# Patient Record
Sex: Female | Born: 1935 | Race: White | Hispanic: No | Smoking: Current every day smoker
Health system: Southern US, Community
[De-identification: ages and names within clinical notes are randomized; demographics above are authoritative.]

## PROBLEM LIST (undated history)

## (undated) DIAGNOSIS — M349 Systemic sclerosis, unspecified: Secondary | ICD-10-CM

## (undated) DIAGNOSIS — IMO0002 Reserved for concepts with insufficient information to code with codable children: Secondary | ICD-10-CM

## (undated) DIAGNOSIS — M329 Systemic lupus erythematosus, unspecified: Secondary | ICD-10-CM

## (undated) DIAGNOSIS — R000 Tachycardia, unspecified: Secondary | ICD-10-CM

## (undated) DIAGNOSIS — M069 Rheumatoid arthritis, unspecified: Secondary | ICD-10-CM

## (undated) HISTORY — PX: BACK SURGERY: SHX140

## (undated) HISTORY — PX: ABDOMINAL HYSTERECTOMY: SHX81

## (undated) HISTORY — PX: LEG SURGERY: SHX1003

---

## 2014-12-13 ENCOUNTER — Emergency Department (HOSPITAL_BASED_OUTPATIENT_CLINIC_OR_DEPARTMENT_OTHER)
Admission: EM | Admit: 2014-12-13 | Discharge: 2014-12-13 | Disposition: A | Discharge status: Home / Self Care | Payer: Medicare Other | Attending: Emergency Medicine | Admitting: Emergency Medicine

## 2014-12-13 ENCOUNTER — Emergency Department (HOSPITAL_BASED_OUTPATIENT_CLINIC_OR_DEPARTMENT_OTHER): Payer: Medicare Other

## 2014-12-13 ENCOUNTER — Encounter (HOSPITAL_BASED_OUTPATIENT_CLINIC_OR_DEPARTMENT_OTHER): Payer: Self-pay

## 2014-12-13 DIAGNOSIS — S8992XA Unspecified injury of left lower leg, initial encounter: Secondary | ICD-10-CM | POA: Insufficient documentation

## 2014-12-13 DIAGNOSIS — W1839XA Other fall on same level, initial encounter: Secondary | ICD-10-CM | POA: Insufficient documentation

## 2014-12-13 DIAGNOSIS — Z720 Tobacco use: Secondary | ICD-10-CM | POA: Insufficient documentation

## 2014-12-13 DIAGNOSIS — S2232XA Fracture of one rib, left side, initial encounter for closed fracture: Secondary | ICD-10-CM

## 2014-12-13 DIAGNOSIS — Z880 Allergy status to penicillin: Secondary | ICD-10-CM | POA: Insufficient documentation

## 2014-12-13 DIAGNOSIS — Y9289 Other specified places as the place of occurrence of the external cause: Secondary | ICD-10-CM | POA: Diagnosis not present

## 2014-12-13 DIAGNOSIS — S2242XA Multiple fractures of ribs, left side, initial encounter for closed fracture: Secondary | ICD-10-CM | POA: Diagnosis not present

## 2014-12-13 DIAGNOSIS — Z8739 Personal history of other diseases of the musculoskeletal system and connective tissue: Secondary | ICD-10-CM | POA: Insufficient documentation

## 2014-12-13 DIAGNOSIS — Y999 Unspecified external cause status: Secondary | ICD-10-CM | POA: Diagnosis not present

## 2014-12-13 DIAGNOSIS — S29001A Unspecified injury of muscle and tendon of front wall of thorax, initial encounter: Secondary | ICD-10-CM | POA: Diagnosis present

## 2014-12-13 DIAGNOSIS — Y939 Activity, unspecified: Secondary | ICD-10-CM | POA: Diagnosis not present

## 2014-12-13 HISTORY — DX: Reserved for concepts with insufficient information to code with codable children: IMO0002

## 2014-12-13 HISTORY — DX: Rheumatoid arthritis, unspecified: M06.9

## 2014-12-13 HISTORY — DX: Systemic sclerosis, unspecified: M34.9

## 2014-12-13 HISTORY — DX: Systemic lupus erythematosus, unspecified: M32.9

## 2014-12-13 HISTORY — DX: Tachycardia, unspecified: R00.0

## 2014-12-13 MED ORDER — TRAMADOL HCL 50 MG PO TABS
50.0000 mg | ORAL_TABLET | Freq: Once | ORAL | Status: AC
  Administered 2014-12-13: 50 mg via ORAL
  Filled 2014-12-13: qty 1

## 2014-12-13 NOTE — ED Provider Notes (Signed)
CSN: [id number]     Arrival date & time 12/13/14  1402 History   First MD Initiated Contact with Patient 12/13/14 1459     Chief Complaint  Patient presents with  . Fall     (Consider location/radiation/quality/duration/timing/severity/associated sxs/prior Treatment) The history is provided by the patient.  Ariel Washington is a 81 y.o. female hx of lupus, rheumatoid arthritis, scleroderma here presenting with left-sided rib pain, left knee pain. Patient had a mechanical fall on July 2. Since then she has been having intermittent left-sided breast pain and rib pain. Minimal shortness of breath but no chest pain. Also has some left knee pain as well. Denies any loss of consciousness or head injury during that time. Sent here by her doctor for x-rays.   Past Medical History  Diagnosis Date  . Lupus   . RA (rheumatoid arthritis)   . Scleredema   . Tachycardia    Past Surgical History  Procedure Laterality Date  . Abdominal hysterectomy    . Leg surgery    . Back surgery     No family history on file. History  Substance Use Topics  . Smoking status: Current Every Day Smoker  . Smokeless tobacco: Not on file  . Alcohol Use: No   OB History    No data available     Review of Systems  Musculoskeletal:       L knee pain, L rib pain   All other systems reviewed and are negative.     Allergies  Penicillins and Sulfa antibiotics  Home Medications   Prior to Admission medications   Medication Sig Start Date End Date Taking? Authorizing Provider  Acetaminophen (TYLENOL EXTRA STRENGTH PO) Take by mouth.   Yes Historical Provider, MD  DiltiaZEM HCl (CARDIZEM PO) Take by mouth.   Yes Historical Provider, MD  Omeprazole (PRILOSEC PO) Take by mouth.   Yes Historical Provider, MD  TRAMADOL HCL PO Take by mouth.   Yes Historical Provider, MD  UNKNOWN TO PATIENT HTN med   Yes Historical Provider, MD   BP 146/70 mmHg  Pulse 91  Temp(Src) 98.2 F (36.8 C) (Oral)  Resp 16  Ht 5'  3" (1.6 m)  Wt 165 lb (74.844 kg)  BMI 29.24 kg/m2  SpO2 99% Physical Exam  Constitutional: She is oriented to person, place, and time. She appears well-developed.  HENT:  Head: Normocephalic and atraumatic.  Mouth/Throat: Oropharynx is clear and moist.  Eyes: Conjunctivae are normal. Pupils are equal, round, and reactive to light.  Neck: Normal range of motion. Neck supple.  Cardiovascular: Normal rate, regular rhythm and normal heart sounds.   Pulmonary/Chest: Effort normal and breath sounds normal.  Reproducible L rib tenderness, no obvious ecchymosis or deformity   Abdominal: Soft. Bowel sounds are normal. She exhibits no distension. There is no tenderness.  Musculoskeletal:  L knee with small effusion, nl ROM, no obvious deformity. 2+ pulses   Neurological: She is alert and oriented to person, place, and time.  Skin: Skin is warm and dry.  Psychiatric: She has a normal mood and affect. Her behavior is normal. Judgment and thought content normal.  Nursing note and vitals reviewed.   ED Course  Procedures (including critical care time) Labs Review Labs Reviewed - No data to display  Imaging Review Dg Ribs Unilateral W/chest Left  12/13/2014   CLINICAL DATA:  Larey Seat with left anterior rib pain. History of lupus and rheumatoid arthritis.  EXAM: LEFT RIBS AND CHEST - 3+ VIEW  COMPARISON:  09/06/2014  FINDINGS: Subtle parenchymal lung densities are unchanged. No focal airspace disease. Heart and mediastinum are within normal limits. Atherosclerotic calcifications at the aortic arch. Stable joint space narrowing at the glenohumeral joints bilaterally. Question minimally displaced fractures involving the anterior left fifth and sixth ribs. Negative for pneumothorax.  IMPRESSION: Questionable fractures involving the anterior left fifth and sixth ribs.  Chronic lung changes without acute findings.  Chronic joint space narrowing at the glenohumeral joints.   Electronically Signed   By: Richarda OverlieAdam   Henn M.D.   On: 12/13/2014 15:47   Dg Knee Complete 4 Views Left  12/13/2014   CLINICAL DATA:  Fall onto the LEFT knee. Bruising and swelling in the anterior aspect of the knee. Unable the straighten knee. Initial encounter.  EXAM: LEFT KNEE - COMPLETE 4+ VIEW  COMPARISON:  None.  FINDINGS: Varus deformity of the knee is present. The knee is flexed at the time of frontal imaging. There is severe medial compartment osteoarthritis with complete loss of the joint space and chronic osteoarthritic remodeling. Calcified extruded meniscus is present. Large marginal osteophytes.  The lateral joint space is widened associated with the varus deformity. Chondrocalcinosis of the lateral meniscus. Severe patellofemoral osteoarthritis. Small knee effusion. Probable small calcified loose body in the lateral suprapatellar pouch. No fracture or acute osseous injury identified.  IMPRESSION: Severe medial and patellofemoral compartment osteoarthritis without an acute osseous abnormality. Varus deformity associated with collapse of the medial joint space.   Electronically Signed   By: Andreas NewportGeoffrey  Lamke M.D.   On: 12/13/2014 15:55     EKG Interpretation None      MDM   Final diagnoses:  None    Ariel Washington is a 81 y.o. female here with fall 10 days ago. Likely contusion but will get xrays. I doubt ACS.   4:45 PM Xray showed L 5th, 6th rib fractures. Vitals stable. Never hypoxic. Her tramadol was decreased by PMD, will increase back to usual dose of 1-2 Q6hrs prn.     Richardean Canalavid H Yao, MD 12/13/14 [phone]

## 2014-12-13 NOTE — ED Notes (Signed)
Pt fell onto walker 7/2-pain to both knee and under left breat area-states pain to left breast is worse with deep breaths

## 2014-12-13 NOTE — Discharge Instructions (Signed)
Increase tramadol to 1-2 tabs every 6 hrs for pain since you have rib fracture. Please have your doctor give you another prescription.   See your doctor.   Return to ER if you have worse pain, trouble breathing, chest pain.

## 2016-09-10 IMAGING — CR DG RIBS W/ CHEST 3+V*L*
3 series · 3 of 3 positions shown · non-contrast
Comparison: 09/06/2014

CLINICAL DATA: Fell with left anterior rib pain. History of lupus
and rheumatoid arthritis.

EXAM:
LEFT RIBS AND CHEST - 3+ VIEW

[w chest pa]
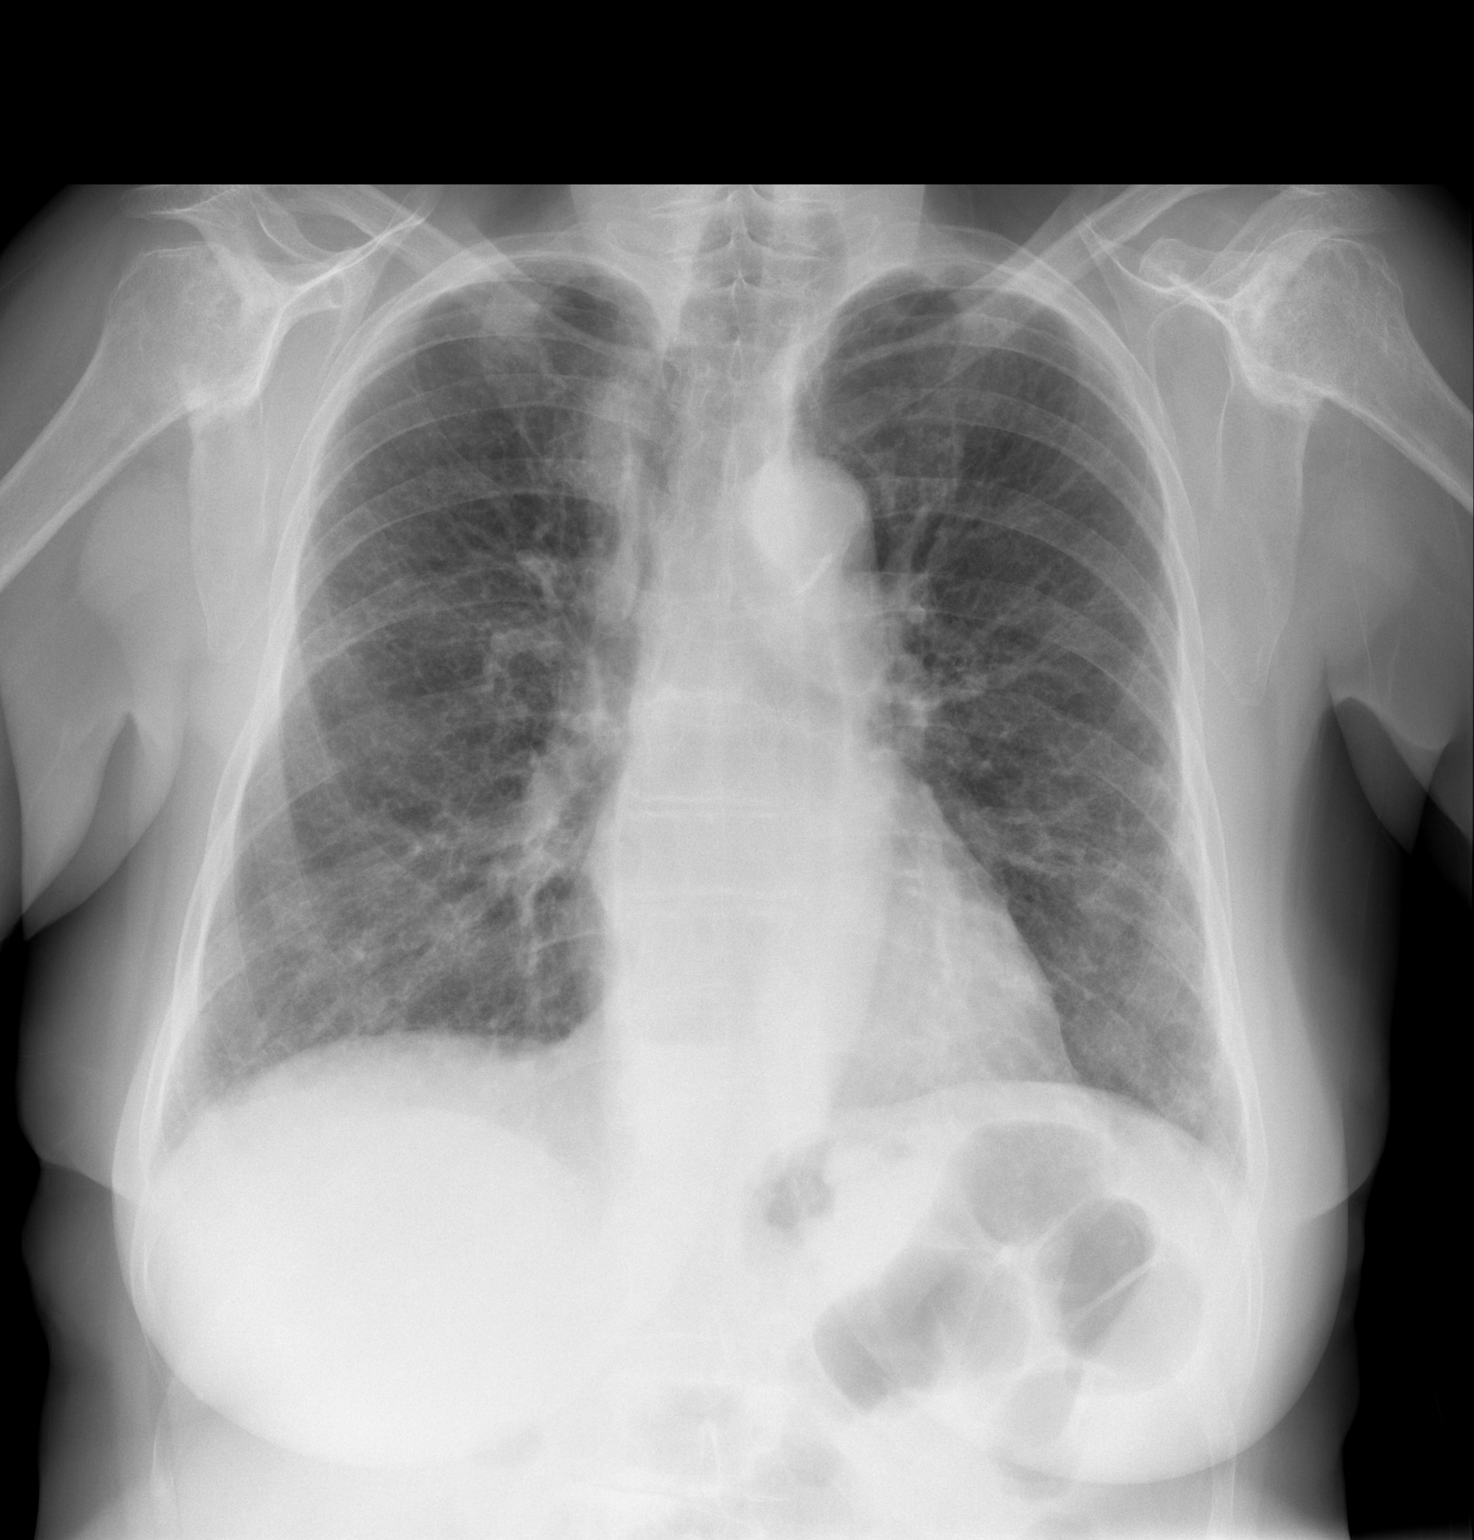

[w ribs ap/pa upper left]
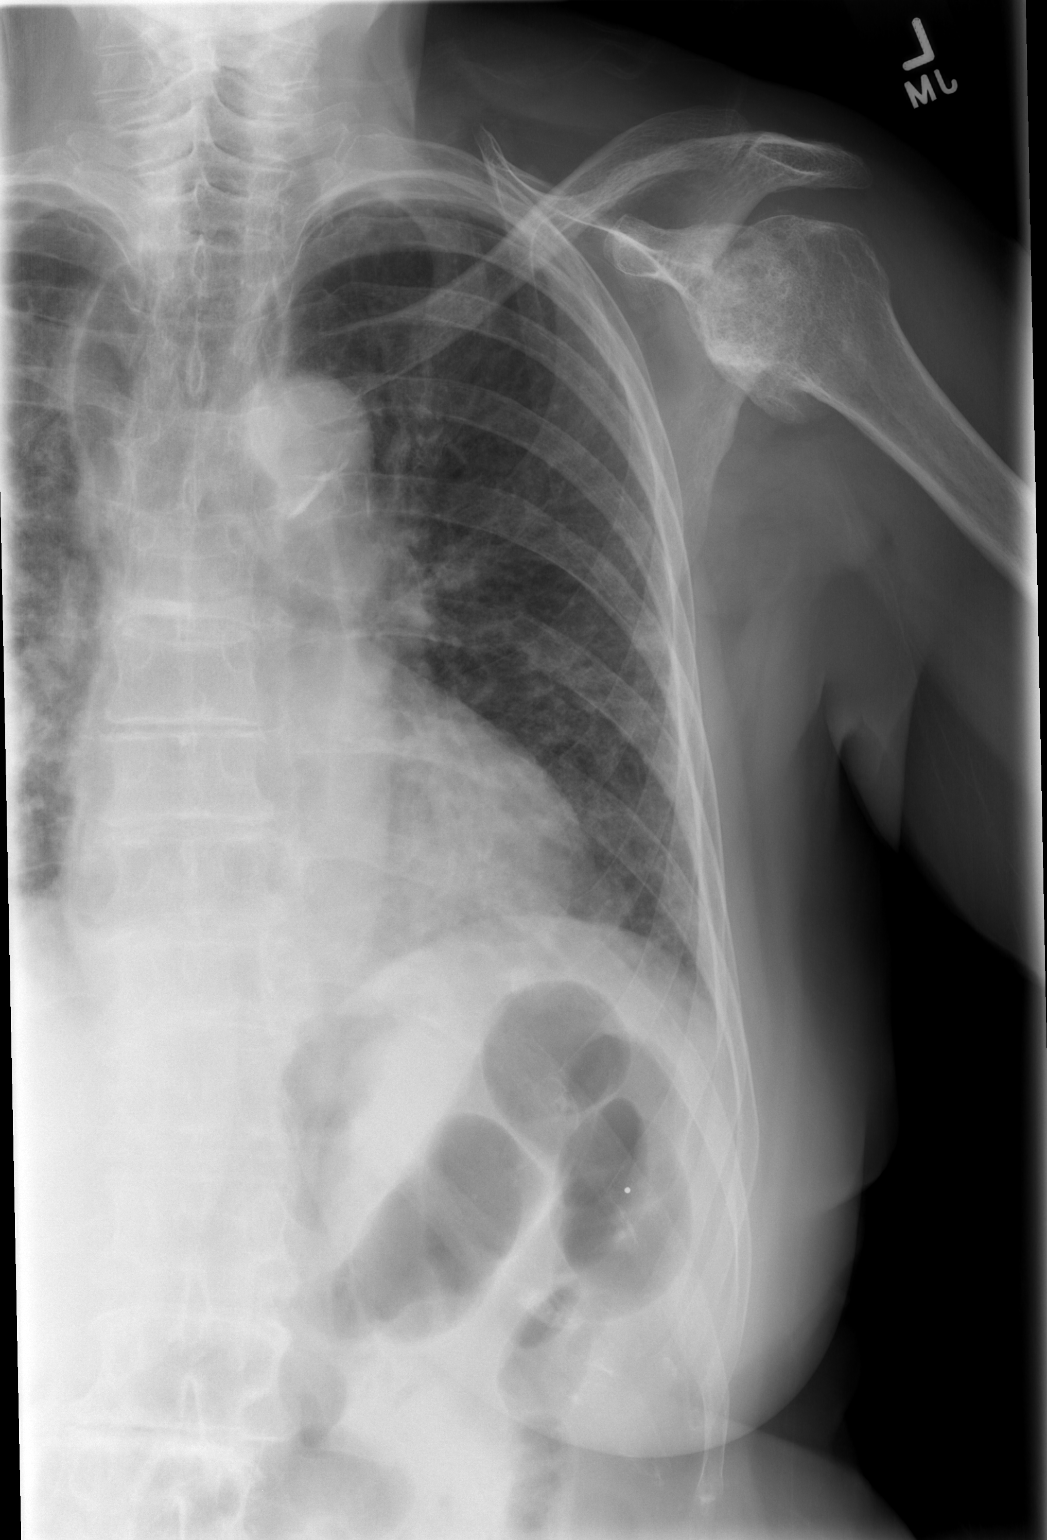

[w ribs oblique left]
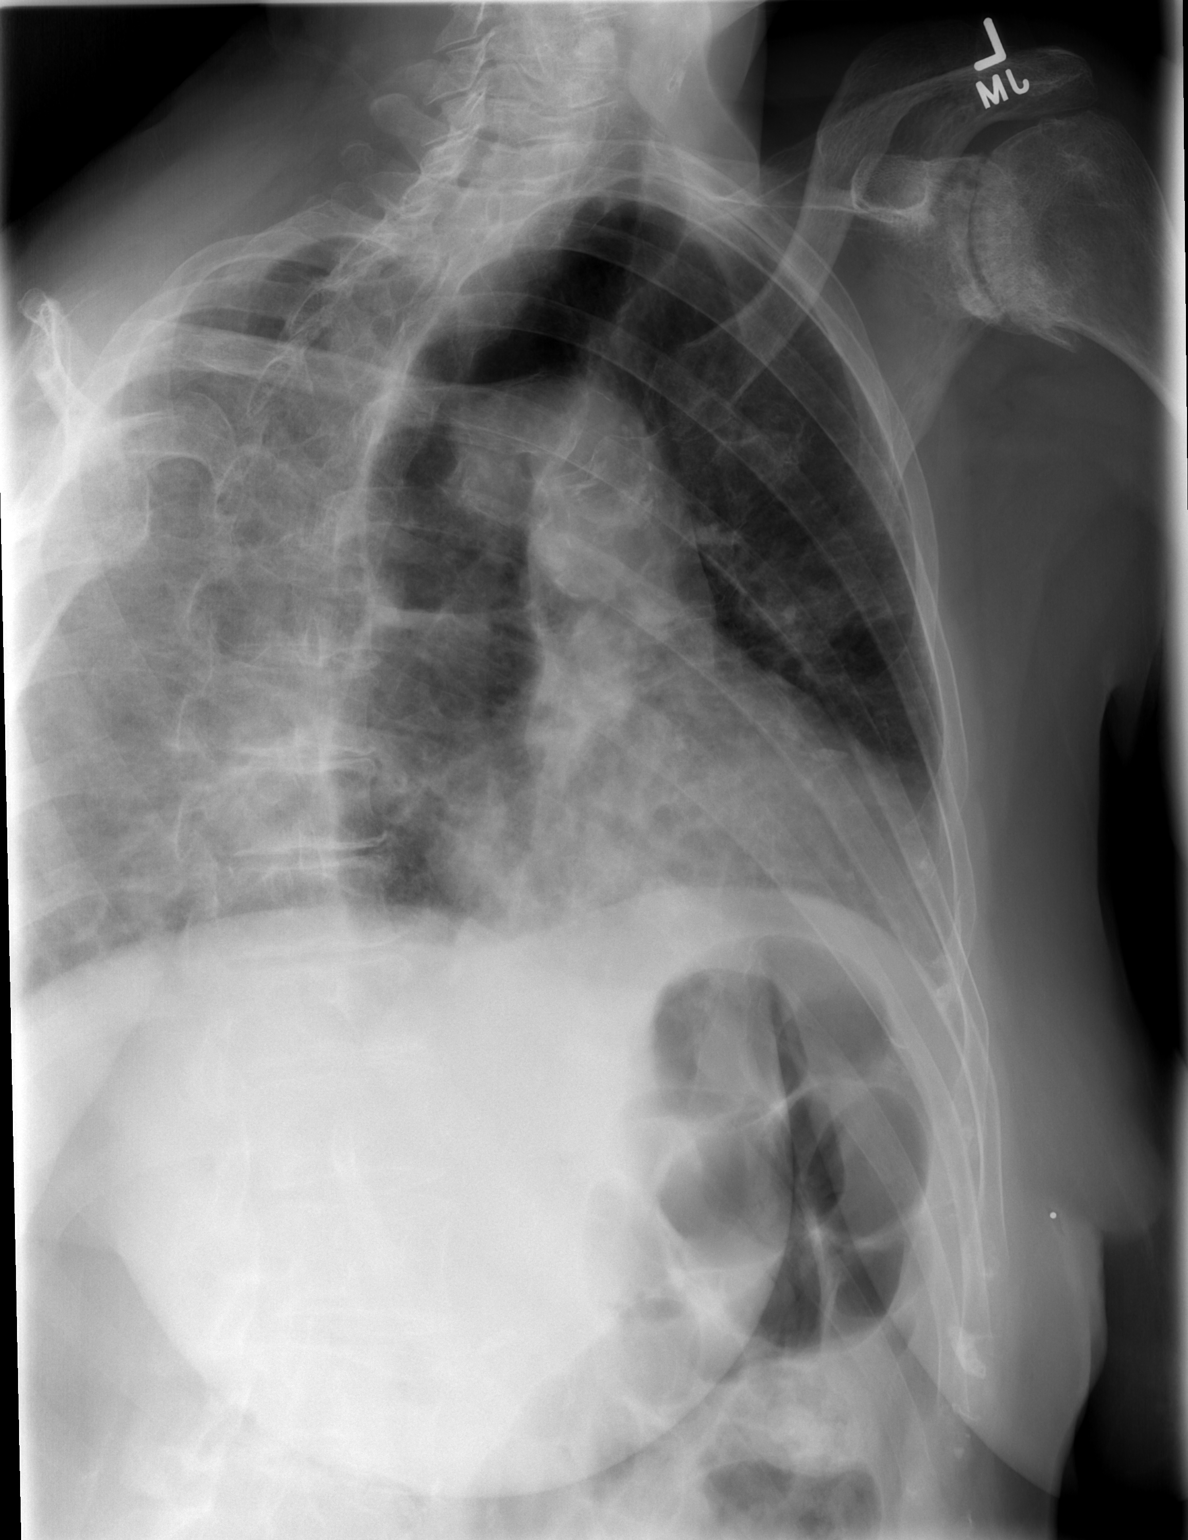

[3 of 3 positions shown; findings below may reference images not displayed]

FINDINGS: Subtle parenchymal lung densities are unchanged. No focal airspace
disease. Heart and mediastinum are within normal limits.
Atherosclerotic calcifications at the aortic arch. Stable joint
space narrowing at the glenohumeral joints bilaterally. Question
minimally displaced fractures involving the anterior left fifth and
sixth ribs. Negative for pneumothorax.
IMPRESSION: Questionable fractures involving the anterior left fifth and sixth
ribs.

Chronic lung changes without acute findings.

Chronic joint space narrowing at the glenohumeral joints.

## 2021-10-31 DEATH — deceased
